# Patient Record
Sex: Female | Born: 1950 | Race: Black or African American | Hispanic: No | Smoking: Former smoker
Health system: Southern US, Community
[De-identification: ages and names within clinical notes are randomized; demographics above are authoritative.]

## PROBLEM LIST (undated history)

## (undated) DIAGNOSIS — Z860100 Personal history of colon polyps, unspecified: Secondary | ICD-10-CM

## (undated) DIAGNOSIS — I10 Essential (primary) hypertension: Secondary | ICD-10-CM

## (undated) DIAGNOSIS — K5790 Diverticulosis of intestine, part unspecified, without perforation or abscess without bleeding: Secondary | ICD-10-CM

## (undated) DIAGNOSIS — E7800 Pure hypercholesterolemia, unspecified: Secondary | ICD-10-CM

## (undated) HISTORY — PX: TUBAL LIGATION: SHX77

## (undated) HISTORY — PX: EYE SURGERY: SHX253

---

## 2005-08-05 ENCOUNTER — Ambulatory Visit: Payer: Self-pay | Admitting: Family Medicine

## 2005-11-03 ENCOUNTER — Ambulatory Visit: Payer: Self-pay | Admitting: Gastroenterology

## 2006-03-16 ENCOUNTER — Ambulatory Visit: Payer: Self-pay | Admitting: Gastroenterology

## 2012-01-26 ENCOUNTER — Ambulatory Visit: Payer: Self-pay | Admitting: Family Medicine

## 2012-10-14 ENCOUNTER — Ambulatory Visit: Payer: Self-pay | Admitting: Gastroenterology

## 2012-10-14 HISTORY — PX: COLONOSCOPY: SHX174

## 2012-10-17 LAB — PATHOLOGY REPORT

## 2013-08-23 ENCOUNTER — Ambulatory Visit: Payer: Self-pay | Admitting: Family Medicine

## 2013-11-27 ENCOUNTER — Emergency Department: Payer: Self-pay | Admitting: Emergency Medicine

## 2013-12-01 ENCOUNTER — Emergency Department: Payer: Self-pay | Admitting: Internal Medicine

## 2015-01-29 ENCOUNTER — Other Ambulatory Visit: Payer: Self-pay | Admitting: Family Medicine

## 2015-01-29 DIAGNOSIS — Z1231 Encounter for screening mammogram for malignant neoplasm of breast: Secondary | ICD-10-CM

## 2015-02-06 ENCOUNTER — Ambulatory Visit
Admission: RE | Admit: 2015-02-06 | Discharge: 2015-02-06 | Disposition: A | Discharge status: Home / Self Care | Payer: Medicare Other | Source: Ambulatory Visit | Attending: Family Medicine | Admitting: Family Medicine

## 2015-02-06 ENCOUNTER — Other Ambulatory Visit: Payer: Self-pay | Admitting: Family Medicine

## 2015-02-06 DIAGNOSIS — Z1231 Encounter for screening mammogram for malignant neoplasm of breast: Secondary | ICD-10-CM

## 2016-06-04 ENCOUNTER — Other Ambulatory Visit: Payer: Self-pay | Admitting: Family Medicine

## 2016-06-04 DIAGNOSIS — Z1231 Encounter for screening mammogram for malignant neoplasm of breast: Secondary | ICD-10-CM

## 2016-06-15 ENCOUNTER — Ambulatory Visit
Admission: RE | Admit: 2016-06-15 | Discharge: 2016-06-15 | Disposition: A | Discharge status: Home / Self Care | Payer: Medicare Other | Source: Ambulatory Visit | Attending: Family Medicine | Admitting: Family Medicine

## 2016-06-15 DIAGNOSIS — Z1231 Encounter for screening mammogram for malignant neoplasm of breast: Secondary | ICD-10-CM | POA: Insufficient documentation

## 2016-12-25 ENCOUNTER — Other Ambulatory Visit: Payer: Self-pay

## 2016-12-25 ENCOUNTER — Emergency Department
Admission: EM | Admit: 2016-12-25 | Discharge: 2016-12-25 | Disposition: A | Discharge status: Home / Self Care | Payer: 59 | Attending: Emergency Medicine | Admitting: Emergency Medicine

## 2016-12-25 ENCOUNTER — Emergency Department: Payer: 59

## 2016-12-25 ENCOUNTER — Encounter: Payer: Self-pay | Admitting: Emergency Medicine

## 2016-12-25 DIAGNOSIS — S5012XA Contusion of left forearm, initial encounter: Secondary | ICD-10-CM | POA: Diagnosis not present

## 2016-12-25 DIAGNOSIS — Y939 Activity, unspecified: Secondary | ICD-10-CM | POA: Insufficient documentation

## 2016-12-25 DIAGNOSIS — Y92410 Unspecified street and highway as the place of occurrence of the external cause: Secondary | ICD-10-CM | POA: Diagnosis not present

## 2016-12-25 DIAGNOSIS — Z880 Allergy status to penicillin: Secondary | ICD-10-CM | POA: Diagnosis not present

## 2016-12-25 DIAGNOSIS — S161XXA Strain of muscle, fascia and tendon at neck level, initial encounter: Secondary | ICD-10-CM | POA: Diagnosis not present

## 2016-12-25 DIAGNOSIS — S199XXA Unspecified injury of neck, initial encounter: Secondary | ICD-10-CM | POA: Diagnosis present

## 2016-12-25 DIAGNOSIS — Y998 Other external cause status: Secondary | ICD-10-CM | POA: Diagnosis not present

## 2016-12-25 MED ORDER — METHOCARBAMOL 500 MG PO TABS: 500.0000 mg | ORAL_TABLET | Freq: Four times a day (QID) | ORAL | 0 refills | Status: DC

## 2016-12-25 MED ORDER — MELOXICAM 15 MG PO TABS: 15.0000 mg | ORAL_TABLET | Freq: Every day | ORAL | 2 refills | Status: AC
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
## 2016-12-25 NOTE — Discharge Instructions (Signed)
Follow-up with your regular doctor if you are not better in 5-7 days, apply ice to your forearm, wet heat followed by ice to your shoulders and neck, use the medication as needed for pain and inflammation, Robaxin is for muscle strain, be careful while taking this medication as it may make you drowsy, if you are becoming worse please return to the emergency department
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
## 2017-06-17 ENCOUNTER — Other Ambulatory Visit: Payer: Self-pay | Admitting: Family Medicine

## 2017-06-17 DIAGNOSIS — Z1231 Encounter for screening mammogram for malignant neoplasm of breast: Secondary | ICD-10-CM

## 2017-06-22 ENCOUNTER — Ambulatory Visit
Admission: RE | Admit: 2017-06-22 | Discharge: 2017-06-22 | Disposition: A | Discharge status: Home / Self Care | Payer: Medicare Other | Source: Ambulatory Visit | Attending: Family Medicine | Admitting: Family Medicine

## 2017-06-22 ENCOUNTER — Encounter (INDEPENDENT_AMBULATORY_CARE_PROVIDER_SITE_OTHER): Payer: Self-pay

## 2017-06-22 DIAGNOSIS — Z1231 Encounter for screening mammogram for malignant neoplasm of breast: Secondary | ICD-10-CM | POA: Insufficient documentation

## 2018-02-14 ENCOUNTER — Encounter: Payer: Self-pay | Admitting: *Deleted

## 2018-02-15 ENCOUNTER — Ambulatory Visit: Payer: Medicare Other | Admitting: Anesthesiology

## 2018-02-15 ENCOUNTER — Ambulatory Visit
Admission: RE | Admit: 2018-02-15 | Discharge: 2018-02-15 | Disposition: A | Discharge status: Home / Self Care | Payer: Medicare Other | Attending: Gastroenterology | Admitting: Gastroenterology

## 2018-02-15 ENCOUNTER — Encounter
Admission: RE | Disposition: A | Discharge status: Home / Self Care | Payer: Self-pay | Source: Home / Self Care | Attending: Gastroenterology

## 2018-02-15 ENCOUNTER — Encounter: Payer: Self-pay | Admitting: Anesthesiology

## 2018-02-15 DIAGNOSIS — Z87891 Personal history of nicotine dependence: Secondary | ICD-10-CM | POA: Diagnosis not present

## 2018-02-15 DIAGNOSIS — Z6834 Body mass index (BMI) 34.0-34.9, adult: Secondary | ICD-10-CM | POA: Diagnosis not present

## 2018-02-15 DIAGNOSIS — E669 Obesity, unspecified: Secondary | ICD-10-CM | POA: Insufficient documentation

## 2018-02-15 DIAGNOSIS — I10 Essential (primary) hypertension: Secondary | ICD-10-CM | POA: Diagnosis not present

## 2018-02-15 DIAGNOSIS — D122 Benign neoplasm of ascending colon: Secondary | ICD-10-CM | POA: Insufficient documentation

## 2018-02-15 DIAGNOSIS — E7800 Pure hypercholesterolemia, unspecified: Secondary | ICD-10-CM | POA: Insufficient documentation

## 2018-02-15 DIAGNOSIS — Z86010 Personal history of colonic polyps: Secondary | ICD-10-CM | POA: Diagnosis not present

## 2018-02-15 DIAGNOSIS — D123 Benign neoplasm of transverse colon: Secondary | ICD-10-CM | POA: Diagnosis not present

## 2018-02-15 DIAGNOSIS — Z7982 Long term (current) use of aspirin: Secondary | ICD-10-CM | POA: Diagnosis not present

## 2018-02-15 DIAGNOSIS — Z79899 Other long term (current) drug therapy: Secondary | ICD-10-CM | POA: Insufficient documentation

## 2018-02-15 DIAGNOSIS — K5730 Diverticulosis of large intestine without perforation or abscess without bleeding: Secondary | ICD-10-CM | POA: Insufficient documentation

## 2018-02-15 DIAGNOSIS — Z1211 Encounter for screening for malignant neoplasm of colon: Secondary | ICD-10-CM | POA: Diagnosis present

## 2018-02-15 HISTORY — PX: COLONOSCOPY WITH PROPOFOL: SHX5780

## 2018-02-15 HISTORY — DX: Pure hypercholesterolemia, unspecified: E78.00

## 2018-02-15 HISTORY — DX: Essential (primary) hypertension: I10

## 2018-02-15 HISTORY — DX: Diverticulosis of intestine, part unspecified, without perforation or abscess without bleeding: K57.90

## 2018-02-15 HISTORY — DX: Personal history of colonic polyps: Z86.010

## 2018-02-15 HISTORY — DX: Personal history of colon polyps, unspecified: Z86.0100

## 2018-02-15 LAB — CBC WITH DIFFERENTIAL/PLATELET
Abs Immature Granulocytes: 0.01 10*3/uL (ref 0.00–0.07)
Basophils Absolute: 0 10*3/uL (ref 0.0–0.1)
Basophils Relative: 1 %
Eosinophils Absolute: 0.1 10*3/uL (ref 0.0–0.5)
Eosinophils Relative: 2 %
HCT: 39.7 % (ref 36.0–46.0)
Hemoglobin: 13.2 g/dL (ref 12.0–15.0)
Immature Granulocytes: 0 %
LYMPHS ABS: 1.3 10*3/uL (ref 0.7–4.0)
Lymphocytes Relative: 36 %
MCH: 27.8 pg (ref 26.0–34.0)
MCHC: 33.2 g/dL (ref 30.0–36.0)
MCV: 83.8 fL (ref 80.0–100.0)
Monocytes Absolute: 0.3 10*3/uL (ref 0.1–1.0)
Monocytes Relative: 8 %
NRBC: 0 % (ref 0.0–0.2)
Neutro Abs: 1.9 10*3/uL (ref 1.7–7.7)
Neutrophils Relative %: 53 %
Platelets: 241 10*3/uL (ref 150–400)
RBC: 4.74 MIL/uL (ref 3.87–5.11)
RDW: 13.2 % (ref 11.5–15.5)
WBC: 3.5 10*3/uL — ABNORMAL LOW (ref 4.0–10.5)

## 2018-02-15 SURGERY — COLONOSCOPY WITH PROPOFOL
Anesthesia: General

## 2018-02-15 MED ORDER — SODIUM CHLORIDE 0.9 % IV SOLN
INTRAVENOUS | Status: DC
  Administered 2018-02-15: 10:00:00 via INTRAVENOUS
  Administered 2018-02-15: 1000 mL via INTRAVENOUS

## 2018-02-15 MED ORDER — SODIUM CHLORIDE (PF) 0.9 % IJ SOLN
INTRAMUSCULAR | Status: DC | PRN
  Administered 2018-02-15: 4.5 mL via INTRAVENOUS
  Administered 2018-02-15: 2.5 mL via INTRAVENOUS

## 2018-02-15 MED ORDER — FENTANYL CITRATE (PF) 100 MCG/2ML IJ SOLN
INTRAMUSCULAR | Status: DC | PRN
  Administered 2018-02-15: 50 ug via INTRAVENOUS

## 2018-02-15 MED ORDER — PROPOFOL 500 MG/50ML IV EMUL
INTRAVENOUS | Status: DC | PRN
  Administered 2018-02-15: 150 ug/kg/min via INTRAVENOUS

## 2018-02-15 MED ORDER — MIDAZOLAM HCL 2 MG/2ML IJ SOLN
INTRAMUSCULAR | Status: DC | PRN
  Administered 2018-02-15: 1 mg via INTRAVENOUS

## 2018-02-15 MED ORDER — PHENYLEPHRINE HCL 10 MG/ML IJ SOLN
INTRAMUSCULAR | Status: DC | PRN
  Administered 2018-02-15 (×2): 100 ug via INTRAVENOUS

## 2018-02-15 MED ORDER — FENTANYL CITRATE (PF) 100 MCG/2ML IJ SOLN
INTRAMUSCULAR | Status: AC
  Filled 2018-02-15: qty 2

## 2018-02-15 MED ORDER — MIDAZOLAM HCL 2 MG/2ML IJ SOLN
INTRAMUSCULAR | Status: AC
  Filled 2018-02-15: qty 2

## 2018-02-15 MED ORDER — PROPOFOL 10 MG/ML IV BOLUS
INTRAVENOUS | Status: DC | PRN
  Administered 2018-02-15: 40 mg via INTRAVENOUS

## 2018-02-15 NOTE — Transfer of Care (Signed)
Immediate Anesthesia Transfer of Care Note  Patient: Wanda Abbott  Procedure(s) Performed: COLONOSCOPY WITH PROPOFOL (N/A )  Patient Location: PACU  Anesthesia Type:General  Level of Consciousness: awake and alert   Airway & Oxygen Therapy: Patient Spontanous Breathing and Patient connected to nasal cannula oxygen  Post-op Assessment: Report given to RN and Post -op Vital signs reviewed and stable  Post vital signs: Reviewed and stable  Last Vitals:  Vitals Value Taken Time  BP 116/86 02/15/2018 11:12 AM  Temp 36.2 C 02/15/2018 11:06 AM  Pulse 65 02/15/2018 11:14 AM  Resp 14 02/15/2018 11:14 AM  SpO2 100 % 02/15/2018 11:14 AM  Vitals shown include unvalidated device data.  Last Pain:  Vitals:   02/15/18 1106  TempSrc: Tympanic  PainSc: Asleep         Complications: No apparent anesthesia complications

## 2018-02-15 NOTE — Anesthesia Post-op Follow-up Note (Signed)
Anesthesia QCDR form completed.        

## 2018-02-15 NOTE — Anesthesia Postprocedure Evaluation (Signed)
Anesthesia Post Note  Patient: Wanda Abbott  Procedure(s) Performed: COLONOSCOPY WITH PROPOFOL (N/A )  Patient location during evaluation: Endoscopy Anesthesia Type: General Level of consciousness: awake and alert Pain management: pain level controlled Vital Signs Assessment: post-procedure vital signs reviewed and stable Respiratory status: spontaneous breathing, nonlabored ventilation, respiratory function stable and patient connected to nasal cannula oxygen Cardiovascular status: blood pressure returned to baseline and stable Postop Assessment: no apparent nausea or vomiting Anesthetic complications: no     Last Vitals:  Vitals:   02/15/18 1126 02/15/18 1136  BP: 125/75 132/85  Pulse:    Resp:    Temp:    SpO2:      Last Pain:  Vitals:   02/15/18 1136  TempSrc:   PainSc: 0-No pain                 Amparo Donalson S

## 2018-02-15 NOTE — Op Note (Signed)
[facility] Gastroenterology Patient Name: Wanda Abbott Procedure Date: 02/15/2018 10:11 AM MRN: [id number] Account #: [id number] Date of Birth: 11-27-1950 Admit Type: Outpatient Age: 68 Room: [facility] ENDO ROOM 2 Gender: Female Note Status: Finalized Procedure:            Colonoscopy Indications:          Personal history of colonic polyps Providers:            Lollie Sails, MD Referring MD:         Dion Body (Referring MD) Medicines:            Monitored Anesthesia Care Complications:        No immediate complications. Procedure:            Pre-Anesthesia Assessment:                       - ASA Grade Assessment: III - A patient with severe                        systemic disease.                       After obtaining informed consent, the colonoscope was                        passed under direct vision. Throughout the procedure,                        the patient's blood pressure, pulse, and oxygen                        saturations were monitored continuously. The                        Colonoscope was introduced through the anus and                        advanced to the the cecum, identified by appendiceal                        orifice and ileocecal valve. The colonoscopy was                        performed with difficulty. Successful completion of the                        procedure was aided by changing the patient to a prone                        position. The patient tolerated the procedure well. The                        quality of the bowel preparation was good. Findings:      Multiple small and large-mouthed diverticula were found in the sigmoid       colon, descending colon, transverse colon and ascending colon.      A 24 mm polyp was found in the proximal transverse colon. The polyp was       sessile. The polyp was removed with a hot snare. The polyp was removed       with  a saline injection-lift technique using a hot snare, cold  snare and       cold forcep. Resection and retrieval were complete. Area was tattooed       with an injection of 2 mL of [place] ink.      A 9 mm polyp was found in the proximal ascending colon. The polyp was       semi-pedunculated. The polyp was removed with a cold snare. Resection       and retrieval were complete.      The retroflexed view of the distal rectum and anal verge was normal and       showed no anal or rectal abnormalities.      The digital rectal exam was normal.      A tattoo was seen in the ascending colon. A post-polypectomy scar was       found at the tattoo site. There was no evidence of residual polyp tissue. Impression:           - Diverticulosis in the sigmoid colon, in the                        descending colon, in the transverse colon and in the                        ascending colon.                       - One 24 mm polyp in the proximal transverse colon,                        removed with a hot snare and removed using                        injection-lift and a hot snare. Resected and retrieved.                        Tattooed.                       - One 9 mm polyp in the proximal ascending colon,                        removed with a cold snare. Resected and retrieved.                       - The distal rectum and anal verge are normal on                        retroflexion view.                       - A tattoo was seen in the ascending colon. A                        post-polypectomy scar was found at the tattoo site.                        There was no evidence of residual polyp tissue. Recommendation:       - Discharge patient to home.                       -  Await pathology results.                       - Telephone GI clinic for pathology results in 1 week. Procedure Code(s):    --- Professional ---                       [phone], Colonoscopy, flexible; with removal of tumor(s),                        polyp(s), or other lesion(s) by snare technique                        [id number], Colonoscopy, flexible; with directed submucosal                        injection(s), any substance Diagnosis Code(s):    --- Professional ---                       D12.3, Benign neoplasm of transverse colon (hepatic                        flexure or splenic flexure)                       D12.2, Benign neoplasm of ascending colon                       Z86.010, Personal history of colonic polyps                       K57.30, Diverticulosis of large intestine without                        perforation or abscess without bleeding CPT copyright 2018 American Medical Association. All rights reserved. The codes documented in this report are preliminary and upon coder review may  be revised to meet current compliance requirements. Lollie Sails, MD 02/15/2018 11:04:50 AM This report has been signed electronically. Number of Addenda: 0 Note Initiated On: 02/15/2018 10:11 AM Scope Withdrawal Time: 0 hours 15 minutes 18 seconds  Total Procedure Duration: 0 hours 37 minutes 11 seconds       [facility]

## 2018-02-15 NOTE — Anesthesia Procedure Notes (Signed)
Date/Time: 02/15/2018 10:15 AM Performed by: Allean Found, CRNA Pre-anesthesia Checklist: Patient identified, Emergency Drugs available, Suction available, Patient being monitored and Timeout performed Oxygen Delivery Method: Nasal cannula Placement Confirmation: positive ETCO2

## 2018-02-15 NOTE — H&P (Signed)
Outpatient short stay form Pre-procedure 02/15/2018 10:06 AM Lollie Sails MD  Primary Physician: Dr Dion Body  Reason for visit: Colonoscopy  History of present illness: Patient is a 68 year old female presenting today for colonoscopy in regards her personal history of adenomatous colon polyps.  Her last colonoscopy was 10/14/2012.  She does have a scar in the ascending colon previously marked with a tattoo.  She tolerated her prep well.  She takes a 81 mg aspirin daily held this morning.  She takes no other aspirin products or blood thinning agent.    Current Facility-Administered Medications:  .  0.9 %  sodium chloride infusion, , Intravenous, Continuous, Lollie Sails, MD  Medications Prior to Admission  Medication Sig Dispense Refill Last Dose  . aspirin EC 81 MG tablet Take 81 mg by mouth daily.     . calcium carbonate (OSCAL) 1500 (600 Ca) MG TABS tablet Take 1,500 mg by mouth 2 (two) times daily with a meal.     . fluticasone (FLONASE) 50 MCG/ACT nasal spray Place 2 sprays into both nostrils daily.     . hydrochlorothiazide (HYDRODIURIL) 25 MG tablet Take 25 mg by mouth daily.     Marland Kitchen losartan (COZAAR) 100 MG tablet Take 100 mg by mouth daily.     Marland Kitchen lovastatin (MEVACOR) 20 MG tablet Take 20 mg by mouth at bedtime.     . Multiple Vitamin (MULTIVITAMIN) tablet Take 1 tablet by mouth daily.        Allergies  Allergen Reactions  . Penicillins Anaphylaxis  . Shellfish Allergy Other (See Comments)     Past Medical History:  Diagnosis Date  . Diverticulosis   . History of colonic polyps   . Hypercholesterolemia   . Hypertension     Review of systems:      Physical Exam    Heart and lungs: Regular rate and rhythm without rub or gallop, lungs are bilaterally clear.    HEENT: Normocephalic atraumatic eyes are anicteric    Other:    Pertinant exam for procedure: Soft nontender nondistended bowel sounds positive normoactive.    Planned proceedures:  Colonoscopy and indicated procedures. I have discussed the risks benefits and complications of procedures to include not limited to bleeding, infection, perforation and the risk of sedation and the patient wishes to proceed.    Lollie Sails, MD Gastroenterology 02/15/2018  10:06 AM

## 2018-02-15 NOTE — Anesthesia Preprocedure Evaluation (Signed)
Anesthesia Evaluation  Patient identified by MRN, date of birth, ID band Patient awake    Reviewed: Allergy & Precautions, NPO status , Patient's Chart, lab work & pertinent test results, reviewed documented beta blocker date and time   Airway Mallampati: III  TM Distance: >3 FB     Dental  (+) Chipped   Pulmonary former smoker,           Cardiovascular hypertension, Pt. on medications      Neuro/Psych    GI/Hepatic   Endo/Other    Renal/GU      Musculoskeletal   Abdominal   Peds  Hematology   Anesthesia Other Findings Obese.  Reproductive/Obstetrics                             Anesthesia Physical Anesthesia Plan  ASA: III  Anesthesia Plan: General   Post-op Pain Management:    Induction: Intravenous  PONV Risk Score and Plan:   Airway Management Planned:   Additional Equipment:   Intra-op Plan:   Post-operative Plan:   Informed Consent: I have reviewed the patients History and Physical, chart, labs and discussed the procedure including the risks, benefits and alternatives for the proposed anesthesia with the patient or authorized representative who has indicated his/her understanding and acceptance.       Plan Discussed with: CRNA  Anesthesia Plan Comments:         Anesthesia Quick Evaluation

## 2018-02-16 ENCOUNTER — Encounter: Payer: Self-pay | Admitting: Gastroenterology

## 2018-02-16 LAB — SURGICAL PATHOLOGY

## 2018-09-12 ENCOUNTER — Other Ambulatory Visit: Payer: Self-pay | Admitting: Family Medicine

## 2018-09-12 DIAGNOSIS — Z1231 Encounter for screening mammogram for malignant neoplasm of breast: Secondary | ICD-10-CM

## 2018-10-04 ENCOUNTER — Ambulatory Visit
Admission: RE | Admit: 2018-10-04 | Discharge: 2018-10-04 | Disposition: A | Discharge status: Home / Self Care | Payer: Medicare Other | Source: Ambulatory Visit | Attending: Family Medicine | Admitting: Family Medicine

## 2018-10-04 ENCOUNTER — Other Ambulatory Visit: Payer: Self-pay

## 2018-10-04 DIAGNOSIS — Z1231 Encounter for screening mammogram for malignant neoplasm of breast: Secondary | ICD-10-CM | POA: Diagnosis not present

## 2019-02-17 ENCOUNTER — Ambulatory Visit: Payer: Medicare Other | Attending: Internal Medicine

## 2019-02-17 DIAGNOSIS — Z23 Encounter for immunization: Secondary | ICD-10-CM

## 2019-02-17 NOTE — Progress Notes (Signed)
   Covid-19 Vaccination Clinic  Name:  Wanda Abbott    MRN: [id number] DOB: 1950/12/06  02/17/2019  [name] was observed post Covid-19 immunization for 30 minutes based on pre-vaccination screening without incidence. She was provided with Vaccine Information Sheet and instruction to access the V-Safe system.   [name] was instructed to call 911 with any severe reactions post vaccine: Marland Kitchen Difficulty breathing  . Swelling of your face and throat  . A fast heartbeat  . A bad rash all over your body  . Dizziness and weakness    Immunizations Administered    Name Date Dose VIS Date Route   Moderna COVID-19 Vaccine 02/17/2019  3:18 PM 0.5 mL 12/13/2018 Intramuscular   Manufacturer: Moderna   Lot: [id number]   [id number]

## 2019-03-21 ENCOUNTER — Ambulatory Visit: Payer: Medicare Other | Attending: Internal Medicine

## 2019-03-21 DIAGNOSIS — Z23 Encounter for immunization: Secondary | ICD-10-CM | POA: Insufficient documentation

## 2019-03-21 NOTE — Progress Notes (Signed)
   Covid-19 Vaccination Clinic  Name:  Wanda Abbott    MRN: [id number] DOB: 04-19-1950  03/21/2019  [name] was observed post Covid-19 immunization for 15 minutes without incident. She was provided with Vaccine Information Sheet and instruction to access the V-Safe system.   [name] was instructed to call 911 with any severe reactions post vaccine: Marland Kitchen Difficulty breathing  . Swelling of face and throat  . A fast heartbeat  . A bad rash all over body  . Dizziness and weakness   Immunizations Administered    Name Date Dose VIS Date Route   Moderna COVID-19 Vaccine 03/21/2019  2:35 PM 0.5 mL 12/13/2018 Intramuscular   Manufacturer: Moderna   Lot: [id number]   [place]:[id number]

## 2019-10-23 ENCOUNTER — Other Ambulatory Visit: Payer: Self-pay | Admitting: Family Medicine

## 2019-10-23 DIAGNOSIS — Z1231 Encounter for screening mammogram for malignant neoplasm of breast: Secondary | ICD-10-CM

## 2019-10-30 ENCOUNTER — Ambulatory Visit
Admission: RE | Admit: 2019-10-30 | Discharge: 2019-10-30 | Disposition: A | Discharge status: Home / Self Care | Payer: Medicare Other | Source: Ambulatory Visit | Attending: Family Medicine | Admitting: Family Medicine

## 2019-10-30 ENCOUNTER — Other Ambulatory Visit: Payer: Self-pay

## 2019-10-30 DIAGNOSIS — Z1231 Encounter for screening mammogram for malignant neoplasm of breast: Secondary | ICD-10-CM | POA: Insufficient documentation

## 2020-08-20 ENCOUNTER — Encounter: Payer: Self-pay | Admitting: Ophthalmology

## 2020-08-23 NOTE — Discharge Instructions (Signed)

## 2020-08-27 ENCOUNTER — Encounter
Admission: RE | Disposition: A | Discharge status: Home / Self Care | Payer: Self-pay | Source: Home / Self Care | Attending: Ophthalmology

## 2020-08-27 ENCOUNTER — Other Ambulatory Visit: Payer: Self-pay

## 2020-08-27 ENCOUNTER — Ambulatory Visit: Payer: Medicare Other | Admitting: Anesthesiology

## 2020-08-27 ENCOUNTER — Encounter: Payer: Self-pay | Admitting: Ophthalmology

## 2020-08-27 ENCOUNTER — Ambulatory Visit
Admission: RE | Admit: 2020-08-27 | Discharge: 2020-08-27 | Disposition: A | Discharge status: Home / Self Care | Payer: Medicare Other | Attending: Ophthalmology | Admitting: Ophthalmology

## 2020-08-27 DIAGNOSIS — Z79899 Other long term (current) drug therapy: Secondary | ICD-10-CM | POA: Insufficient documentation

## 2020-08-27 DIAGNOSIS — H2512 Age-related nuclear cataract, left eye: Secondary | ICD-10-CM | POA: Diagnosis not present

## 2020-08-27 DIAGNOSIS — Z91013 Allergy to seafood: Secondary | ICD-10-CM | POA: Insufficient documentation

## 2020-08-27 DIAGNOSIS — Z7982 Long term (current) use of aspirin: Secondary | ICD-10-CM | POA: Insufficient documentation

## 2020-08-27 DIAGNOSIS — Z880 Allergy status to penicillin: Secondary | ICD-10-CM | POA: Insufficient documentation

## 2020-08-27 DIAGNOSIS — Z87891 Personal history of nicotine dependence: Secondary | ICD-10-CM | POA: Insufficient documentation

## 2020-08-27 HISTORY — PX: CATARACT EXTRACTION W/PHACO: SHX586

## 2020-08-27 SURGERY — PHACOEMULSIFICATION, CATARACT, WITH IOL INSERTION
Anesthesia: Monitor Anesthesia Care | Site: Eye | Laterality: Left

## 2020-08-27 MED ORDER — OXYCODONE HCL 5 MG/5ML PO SOLN
5.0000 mg | Freq: Once | ORAL | Status: DC | PRN
Start: 2020-08-27 — End: 2020-08-27

## 2020-08-27 MED ORDER — SIGHTPATH DOSE#1 SODIUM HYALURONATE 23 MG/ML IO SOLUTION
PREFILLED_SYRINGE | INTRAOCULAR | Status: DC | PRN
  Administered 2020-08-27: .6 mL via INTRAOCULAR

## 2020-08-27 MED ORDER — MIDAZOLAM HCL 2 MG/2ML IJ SOLN
INTRAMUSCULAR | Status: DC | PRN
  Administered 2020-08-27 (×2): .5 mg via INTRAVENOUS
  Administered 2020-08-27: 1 mg via INTRAVENOUS

## 2020-08-27 MED ORDER — OXYCODONE HCL 5 MG PO TABS: 5.0000 mg | ORAL_TABLET | Freq: Once | ORAL | Status: DC | PRN

## 2020-08-27 MED ORDER — SIGHTPATH DOSE#1 BSS IO SOLN
INTRAOCULAR | Status: DC | PRN
  Administered 2020-08-27: 60 mL via OPHTHALMIC

## 2020-08-27 MED ORDER — SIGHTPATH DOSE#1 BSS IO SOLN
INTRAOCULAR | Status: DC | PRN
  Administered 2020-08-27: 15 mL

## 2020-08-27 MED ORDER — MOXIFLOXACIN HCL 0.5 % OP SOLN
OPHTHALMIC | Status: DC | PRN
  Administered 2020-08-27: 0.2 mL via OPHTHALMIC

## 2020-08-27 MED ORDER — LACTATED RINGERS IV SOLN: INTRAVENOUS | Status: DC

## 2020-08-27 MED ORDER — CYCLOPENTOLATE HCL 2 % OP SOLN
1.0000 [drp] | OPHTHALMIC | Status: DC | PRN
  Administered 2020-08-27 (×3): 1 [drp] via OPHTHALMIC

## 2020-08-27 MED ORDER — TETRACAINE HCL 0.5 % OP SOLN
1.0000 [drp] | OPHTHALMIC | Status: DC | PRN
  Administered 2020-08-27 (×3): 1 [drp] via OPHTHALMIC

## 2020-08-27 MED ORDER — PHENYLEPHRINE HCL 10 % OP SOLN
1.0000 [drp] | OPHTHALMIC | Status: DC | PRN
Start: 2020-08-27 — End: 2020-08-27
  Administered 2020-08-27 (×3): 1 [drp] via OPHTHALMIC

## 2020-08-27 MED ORDER — LIDOCAINE HCL (PF) 2 % IJ SOLN
INTRAOCULAR | Status: DC | PRN
  Administered 2020-08-27: 1 mL via INTRAOCULAR

## 2020-08-27 MED ORDER — FENTANYL CITRATE (PF) 100 MCG/2ML IJ SOLN
INTRAMUSCULAR | Status: DC | PRN
  Administered 2020-08-27: 50 ug via INTRAVENOUS
  Administered 2020-08-27 (×2): 25 ug via INTRAVENOUS

## 2020-08-27 MED ORDER — SIGHTPATH DOSE#1 SODIUM HYALURONATE 10 MG/ML IO SOLUTION
PREFILLED_SYRINGE | INTRAOCULAR | Status: DC | PRN
  Administered 2020-08-27: 0.55 mL via INTRAOCULAR

## 2020-08-27 SURGICAL SUPPLY — 14 items
CANNULA ANT/CHMB 27GA (MISCELLANEOUS) ×4 IMPLANT
DISSECTOR HYDRO NUCLEUS 50X22 (MISCELLANEOUS) ×2 IMPLANT
GLOVE SURG ENC TEXT LTX SZ7.5 (GLOVE) ×2 IMPLANT
GLOVE SURG SYN 8.5  E (GLOVE) ×1
GLOVE SURG SYN 8.5 E (GLOVE) ×1 IMPLANT
GOWN STRL REUS W/ TWL LRG LVL3 (GOWN DISPOSABLE) ×2 IMPLANT
GOWN STRL REUS W/TWL LRG LVL3 (GOWN DISPOSABLE) ×4
LENS IOL TECNIS EYHANCE 18.5 (Intraocular Lens) ×2 IMPLANT
MARKER SKIN DUAL TIP RULER LAB (MISCELLANEOUS) ×2 IMPLANT
PACK EYE AFTER SURG (MISCELLANEOUS) ×2 IMPLANT
SYR 3ML LL SCALE MARK (SYRINGE) ×2 IMPLANT
SYR TB 1ML LUER SLIP (SYRINGE) ×2 IMPLANT
WATER STERILE IRR 250ML POUR (IV SOLUTION) ×2 IMPLANT
WIPE NON LINTING 3.25X3.25 (MISCELLANEOUS) ×2 IMPLANT

## 2020-08-27 NOTE — H&P (Signed)
[facility]   Primary Care Physician:  Dion Body, MD Ophthalmologist: Dr. Benay Pillow  Pre-Procedure History & Physical: HPI:  Wanda Abbott is a 70 y.o. female here for cataract surgery.   Past Medical History:  Diagnosis Date   Diverticulosis    History of colonic polyps    Hypercholesterolemia    Hypertension     Past Surgical History:  Procedure Laterality Date   COLONOSCOPY  10/14/2012   COLONOSCOPY WITH PROPOFOL N/A 02/15/2018   Procedure: COLONOSCOPY WITH PROPOFOL;  Surgeon: Lollie Sails, MD;  Location: [facility] ENDOSCOPY;  Service: Endoscopy;  Laterality: N/A;   TUBAL LIGATION      Prior to Admission medications   Medication Sig Start Date End Date Taking? Authorizing Provider  aspirin EC 81 MG tablet Take 81 mg by mouth daily.   Yes [provider]  calcium carbonate (OSCAL) 1500 (600 Ca) MG TABS tablet Take 1,500 mg by mouth 2 (two) times daily with a meal.   Yes [provider]  fluticasone (FLONASE) 50 MCG/ACT nasal spray Place 2 sprays into both nostrils daily.   Yes [provider]  hydrochlorothiazide (HYDRODIURIL) 25 MG tablet Take 25 mg by mouth daily.   Yes [provider]  losartan (COZAAR) 100 MG tablet Take 100 mg by mouth daily.   Yes [provider]  lovastatin (MEVACOR) 20 MG tablet Take 20 mg by mouth at bedtime.   Yes [provider]  Multiple Vitamin (MULTIVITAMIN) tablet Take 1 tablet by mouth daily.   Yes [provider]    Allergies as of 07/31/2020 - Review Complete 02/15/2018  Allergen Reaction Noted   Penicillins Anaphylaxis 12/25/2016   Shellfish allergy Other (See Comments) 02/14/2018    Family History  Problem Relation Age of Onset   Breast cancer Sister 51    Social History   Socioeconomic History   Marital status: Widowed    Spouse name: Not on file   Number of children: Not on file   Years of education: Not on file   Highest education level:  Not on file  Occupational History   Not on file  Tobacco Use   Smoking status: Former    Packs/day: 1.00    Years: 24.00    Pack years: 24.00    Types: Cigarettes    Quit date: 03/01/1986    Years since quitting: 34.5   Smokeless tobacco: Current    Types: Chew  Vaping Use   Vaping Use: Never used  Substance and Sexual Activity   Alcohol use: Not Currently   Drug use: Never   Sexual activity: Not on file  Other Topics Concern   Not on file  Social History Narrative   Not on file   Social Determinants of Health   Financial Resource Strain: Not on file  Food Insecurity: Not on file  Transportation Needs: Not on file  Physical Activity: Not on file  Stress: Not on file  Social Connections: Not on file  Intimate Partner Violence: Not on file    Review of Systems: See HPI, otherwise negative ROS  Physical Exam: BP 124/84   Pulse 65   Temp 97.8 F (36.6 C) (Temporal)   Resp 18   Ht '5\' 2"'$  (1.575 m)   Wt 83.9 kg   SpO2 97%   BMI 33.84 kg/m  General:   Alert, cooperative in NAD Head:  Normocephalic and atraumatic. Respiratory:  Normal work of breathing. Cardiovascular:  RRR  Impression/Plan: Wanda Abbott  is here for cataract surgery.  Risks, benefits, limitations, and alternatives regarding cataract surgery have been reviewed with the patient.  Questions have been answered.  All parties agreeable.   Benay Pillow, MD  08/27/2020, 10:48 AM

## 2020-08-27 NOTE — Anesthesia Preprocedure Evaluation (Signed)
Anesthesia Evaluation  Patient identified by MRN, date of birth, ID band Patient awake    Reviewed: NPO status   History of Anesthesia Complications Negative for: history of anesthetic complications  Airway Mallampati: II  TM Distance: >3 FB Neck ROM: full    Dental no notable dental hx.    Pulmonary neg pulmonary ROS, former smoker,    Pulmonary exam normal        Cardiovascular Exercise Tolerance: Good hypertension, Normal cardiovascular exam+ dysrhythmias (PACs)      Neuro/Psych negative neurological ROS  negative psych ROS   GI/Hepatic negative GI ROS, Neg liver ROS,   Endo/Other  Morbid obesity (bmi 34)  Renal/GU CRFRenal disease (ckd3)  negative genitourinary   Musculoskeletal   Abdominal   Peds  Hematology negative hematology ROS (+)   Anesthesia Other Findings pcp: Dion Body, MD at 04/30/2020 ;  cards: Isaias Cowman, MD at 01/23/2020 ;  Reproductive/Obstetrics                             Anesthesia Physical Anesthesia Plan  ASA: 2  Anesthesia Plan: MAC   Post-op Pain Management:    Induction:   PONV Risk Score and Plan:   Airway Management Planned:   Additional Equipment:   Intra-op Plan:   Post-operative Plan:   Informed Consent: I have reviewed the patients History and Physical, chart, labs and discussed the procedure including the risks, benefits and alternatives for the proposed anesthesia with the patient or authorized representative who has indicated his/her understanding and acceptance.       Plan Discussed with: CRNA  Anesthesia Plan Comments:         Anesthesia Quick Evaluation

## 2020-08-27 NOTE — Op Note (Signed)
OPERATIVE NOTE  DIANDREA TIMIAN [id number] 08/27/2020   PREOPERATIVE DIAGNOSIS:  Nuclear sclerotic cataract left eye.  H25.12   POSTOPERATIVE DIAGNOSIS:    Nuclear sclerotic cataract left eye.     PROCEDURE:  Phacoemusification with posterior chamber intraocular lens placement of the left eye   LENS:   Implant Name Type Inv. Item Serial No. Manufacturer Lot No. LRB No. Used Action  LENS IOL TECNIS EYHANCE 18.5 - [id number] Intraocular Lens LENS IOL TECNIS EYHANCE 18.5 [id number] JOHNSON   Left 1 Implanted      Procedure(s): CATARACT EXTRACTION PHACO AND INTRAOCULAR LENS PLACEMENT (IOC) LEFT 3.91 00:25.6 (Left)  DIB00 +18.5   ULTRASOUND TIME: 0 minutes 25 seconds.  CDE 3.91   SURGEON:  Benay Pillow, MD, MPH   ANESTHESIA:  Topical with tetracaine drops augmented with 1% preservative-free intracameral lidocaine.  ESTIMATED BLOOD LOSS: <1 mL   COMPLICATIONS:  None.   DESCRIPTION OF PROCEDURE:  The patient was identified in the holding room and transported to the operating room and placed in the supine position under the operating microscope.  The left eye was identified as the operative eye and it was prepped and draped in the usual sterile ophthalmic fashion.   A 1.0 millimeter clear-corneal paracentesis was made at the 5:00 position. 0.5 ml of preservative-free 1% lidocaine with epinephrine was injected into the anterior chamber.  The anterior chamber was filled with Healon 5 viscoelastic.  A 2.4 millimeter keratome was used to make a near-clear corneal incision at the 2:00 position.  A curvilinear capsulorrhexis was made with a cystotome and capsulorrhexis forceps.  Balanced salt solution was used to hydrodissect and hydrodelineate the nucleus.   Phacoemulsification was then used in stop and chop fashion to remove the lens nucleus and epinucleus.  The remaining cortex was then removed using the irrigation and aspiration handpiece. Healon was then placed into the capsular bag to  distend it for lens placement.  A lens was then injected into the capsular bag.  The remaining viscoelastic was aspirated.   Wounds were hydrated with balanced salt solution.  The anterior chamber was inflated to a physiologic pressure with balanced salt solution.  Intracameral vigamox 0.1 mL undiltued was injected into the eye and a drop placed onto the ocular surface.  No wound leaks were noted.  The patient was taken to the recovery room in stable condition without complications of anesthesia or surgery  Benay Pillow 08/27/2020, 11:18 AM

## 2020-08-27 NOTE — Anesthesia Procedure Notes (Signed)
Procedure Name: MAC Date/Time: 08/27/2020 10:57 AM Performed by: Dionne Bucy, CRNA Pre-anesthesia Checklist: Patient identified, Emergency Drugs available, Suction available, Patient being monitored and Timeout performed Patient Re-evaluated:Patient Re-evaluated prior to induction Oxygen Delivery Method: Nasal cannula Placement Confirmation: positive ETCO2

## 2020-08-27 NOTE — Transfer of Care (Signed)
Immediate Anesthesia Transfer of Care Note  Patient: Wanda Abbott  Procedure(s) Performed: CATARACT EXTRACTION PHACO AND INTRAOCULAR LENS PLACEMENT (IOC) LEFT 3.91 00:25.6 (Left: Eye)  Patient Location: PACU  Anesthesia Type: MAC  Level of Consciousness: awake, alert  and patient cooperative  Airway and Oxygen Therapy: Patient Spontanous Breathing and Patient connected to supplemental oxygen  Post-op Assessment: Post-op Vital signs reviewed, Patient's Cardiovascular Status Stable, Respiratory Function Stable, Patent Airway and No signs of Nausea or vomiting  Post-op Vital Signs: Reviewed and stable  Complications: No notable events documented.

## 2020-08-27 NOTE — Anesthesia Postprocedure Evaluation (Signed)
Anesthesia Post Note  Patient: MAYLA BIDDY  Procedure(s) Performed: CATARACT EXTRACTION PHACO AND INTRAOCULAR LENS PLACEMENT (IOC) LEFT 3.91 00:25.6 (Left: Eye)     Patient location during evaluation: PACU Anesthesia Type: MAC Level of consciousness: awake and alert Pain management: pain level controlled Vital Signs Assessment: post-procedure vital signs reviewed and stable Respiratory status: spontaneous breathing, nonlabored ventilation, respiratory function stable and patient connected to nasal cannula oxygen Cardiovascular status: stable and blood pressure returned to baseline Postop Assessment: no apparent nausea or vomiting Anesthetic complications: no   No notable events documented.  Fidel Levy

## 2020-08-28 ENCOUNTER — Encounter: Payer: Self-pay | Admitting: Ophthalmology

## 2020-09-05 NOTE — Discharge Instructions (Signed)

## 2020-09-09 ENCOUNTER — Ambulatory Visit
Admission: RE | Admit: 2020-09-09 | Discharge: 2020-09-09 | Disposition: A | Discharge status: Home / Self Care | Payer: Medicare Other | Attending: Ophthalmology | Admitting: Ophthalmology

## 2020-09-09 ENCOUNTER — Ambulatory Visit: Payer: Medicare Other | Admitting: Anesthesiology

## 2020-09-09 ENCOUNTER — Other Ambulatory Visit: Payer: Self-pay

## 2020-09-09 ENCOUNTER — Encounter
Admission: RE | Disposition: A | Discharge status: Home / Self Care | Payer: Self-pay | Source: Home / Self Care | Attending: Ophthalmology

## 2020-09-09 ENCOUNTER — Encounter: Payer: Self-pay | Admitting: Ophthalmology

## 2020-09-09 DIAGNOSIS — Z91013 Allergy to seafood: Secondary | ICD-10-CM | POA: Insufficient documentation

## 2020-09-09 DIAGNOSIS — H2511 Age-related nuclear cataract, right eye: Secondary | ICD-10-CM | POA: Diagnosis not present

## 2020-09-09 DIAGNOSIS — Z7982 Long term (current) use of aspirin: Secondary | ICD-10-CM | POA: Insufficient documentation

## 2020-09-09 DIAGNOSIS — Z880 Allergy status to penicillin: Secondary | ICD-10-CM | POA: Insufficient documentation

## 2020-09-09 DIAGNOSIS — Z87891 Personal history of nicotine dependence: Secondary | ICD-10-CM | POA: Insufficient documentation

## 2020-09-09 HISTORY — PX: CATARACT EXTRACTION W/PHACO: SHX586

## 2020-09-09 SURGERY — PHACOEMULSIFICATION, CATARACT, WITH IOL INSERTION
Anesthesia: Monitor Anesthesia Care | Site: Eye | Laterality: Right

## 2020-09-09 MED ORDER — CYCLOPENTOLATE HCL 2 % OP SOLN
1.0000 [drp] | OPHTHALMIC | Status: AC | PRN
  Administered 2020-09-09 (×3): 1 [drp] via OPHTHALMIC

## 2020-09-09 MED ORDER — SIGHTPATH DOSE#1 BSS IO SOLN
INTRAOCULAR | Status: DC | PRN
  Administered 2020-09-09: 15 mL

## 2020-09-09 MED ORDER — ACETAMINOPHEN 10 MG/ML IV SOLN: 1000.0000 mg | Freq: Once | INTRAVENOUS | Status: DC | PRN

## 2020-09-09 MED ORDER — LIDOCAINE HCL (PF) 2 % IJ SOLN
INTRAOCULAR | Status: DC | PRN
  Administered 2020-09-09: 1 mL via INTRAOCULAR

## 2020-09-09 MED ORDER — SIGHTPATH DOSE#1 SODIUM HYALURONATE 10 MG/ML IO SOLUTION
PREFILLED_SYRINGE | INTRAOCULAR | Status: DC | PRN
  Administered 2020-09-09: 0.55 mL via INTRAOCULAR

## 2020-09-09 MED ORDER — FENTANYL CITRATE (PF) 100 MCG/2ML IJ SOLN
INTRAMUSCULAR | Status: DC | PRN
  Administered 2020-09-09: 100 ug via INTRAVENOUS

## 2020-09-09 MED ORDER — MIDAZOLAM HCL 2 MG/2ML IJ SOLN
INTRAMUSCULAR | Status: DC | PRN
  Administered 2020-09-09: 2 mg via INTRAVENOUS

## 2020-09-09 MED ORDER — PHENYLEPHRINE HCL 10 % OP SOLN
1.0000 [drp] | OPHTHALMIC | Status: AC
  Administered 2020-09-09 (×3): 1 [drp] via OPHTHALMIC

## 2020-09-09 MED ORDER — LACTATED RINGERS IV SOLN: INTRAVENOUS | Status: DC

## 2020-09-09 MED ORDER — SIGHTPATH DOSE#1 BSS IO SOLN
INTRAOCULAR | Status: DC | PRN
  Administered 2020-09-09: 56 mL via OPHTHALMIC

## 2020-09-09 MED ORDER — ONDANSETRON HCL 4 MG/2ML IJ SOLN: 4.0000 mg | Freq: Once | INTRAMUSCULAR | Status: DC | PRN

## 2020-09-09 MED ORDER — SIGHTPATH DOSE#1 SODIUM HYALURONATE 23 MG/ML IO SOLUTION
PREFILLED_SYRINGE | INTRAOCULAR | Status: DC | PRN
  Administered 2020-09-09: .6 mL via INTRAOCULAR

## 2020-09-09 MED ORDER — TETRACAINE HCL 0.5 % OP SOLN
1.0000 [drp] | OPHTHALMIC | Status: DC | PRN
  Administered 2020-09-09 (×3): 1 [drp] via OPHTHALMIC

## 2020-09-09 SURGICAL SUPPLY — 14 items
CANNULA ANT/CHMB 27GA (MISCELLANEOUS) ×4 IMPLANT
DISSECTOR HYDRO NUCLEUS 50X22 (MISCELLANEOUS) ×2 IMPLANT
GLOVE SURG GAMMEX PI TX LF 7.5 (GLOVE) ×2 IMPLANT
GLOVE SURG SYN 8.5  E (GLOVE) ×1
GLOVE SURG SYN 8.5 E (GLOVE) ×1 IMPLANT
GOWN STRL REUS W/ TWL LRG LVL3 (GOWN DISPOSABLE) ×2 IMPLANT
GOWN STRL REUS W/TWL LRG LVL3 (GOWN DISPOSABLE) ×4
LENS IOL TECNIS EYHANCE 19.0 (Intraocular Lens) ×2 IMPLANT
MARKER SKIN DUAL TIP RULER LAB (MISCELLANEOUS) ×2 IMPLANT
PACK EYE AFTER SURG (MISCELLANEOUS) ×2 IMPLANT
SYR 3ML LL SCALE MARK (SYRINGE) ×2 IMPLANT
SYR TB 1ML LUER SLIP (SYRINGE) ×2 IMPLANT
WATER STERILE IRR 250ML POUR (IV SOLUTION) ×2 IMPLANT
WIPE NON LINTING 3.25X3.25 (MISCELLANEOUS) ×2 IMPLANT

## 2020-09-09 NOTE — H&P (Signed)
[facility]   Primary Care Physician:  Dion Body, MD Ophthalmologist: Dr. Benay Pillow  Pre-Procedure History & Physical: HPI:  Wanda Abbott is a 70 y.o. female here for cataract surgery.   Past Medical History:  Diagnosis Date   Diverticulosis    History of colonic polyps    Hypercholesterolemia    Hypertension     Past Surgical History:  Procedure Laterality Date   CATARACT EXTRACTION W/PHACO Left 08/27/2020   Procedure: CATARACT EXTRACTION PHACO AND INTRAOCULAR LENS PLACEMENT (IOC) LEFT 3.91 00:25.6;  Surgeon: Eulogio Bear, MD;  Location: [place];  Service: Ophthalmology;  Laterality: Left;   COLONOSCOPY  10/14/2012   COLONOSCOPY WITH PROPOFOL N/A 02/15/2018   Procedure: COLONOSCOPY WITH PROPOFOL;  Surgeon: Lollie Sails, MD;  Location: [facility] END[facility]COPY;  Service: Endoscopy;  Laterality: N/A;   TUBAL LIGATION      Prior to Admission medications   Medication Sig Start Date End Date Taking? Authorizing Provider  aspirin EC 81 MG tablet Take 81 mg by mouth daily.   Yes [provider]  calcium carbonate (OSCAL) 1500 (600 Ca) MG TABS tablet Take 1,500 mg by mouth 2 (two) times daily with a meal.   Yes [provider]  fluticasone (FLONASE) 50 MCG/ACT nasal spray Place 2 sprays into both nostrils daily.   Yes [provider]  hydrochlorothiazide (HYDRODIURIL) 25 MG tablet Take 25 mg by mouth daily.   Yes [provider]  losartan (COZAAR) 100 MG tablet Take 100 mg by mouth daily.   Yes [provider]  lovastatin (MEVACOR) 20 MG tablet Take 20 mg by mouth at bedtime.   Yes [provider]  Multiple Vitamin (MULTIVITAMIN) tablet Take 1 tablet by mouth daily.   Yes [provider]    Allergies as of 07/31/2020 - Review Complete 02/15/2018  Allergen Reaction Noted   Penicillins Anaphylaxis 12/25/2016   Shellfish allergy Other (See Comments) 02/14/2018    Family History  Problem  Relation Age of Onset   Breast cancer Sister 28    Social History   Socioeconomic History   Marital status: Widowed    Spouse name: Not on file   Number of children: Not on file   Years of education: Not on file   Highest education level: Not on file  Occupational History   Not on file  Tobacco Use   Smoking status: Former    Packs/day: 1.00    Years: 24.00    Pack years: 24.00    Types: Cigarettes    Quit date: 03/01/1986    Years since quitting: 34.5   Smokeless tobacco: Current    Types: Chew  Vaping Use   Vaping Use: Never used  Substance and Sexual Activity   Alcohol use: Not Currently   Drug use: Never   Sexual activity: Not on file  Other Topics Concern   Not on file  Social History Narrative   Not on file   Social Determinants of Health   Financial Resource Strain: Not on file  Food Insecurity: Not on file  Transportation Needs: Not on file  Physical Activity: Not on file  Stress: Not on file  Social Connections: Not on file  Intimate Partner Violence: Not on file    Review of Systems: See HPI, otherwise negative ROS  Physical Exam: BP 117/75   Pulse (!) 57   Temp (!) 97.2 F (36.2 C) (Temporal)   Resp 18   Ht '5\' 2"'$  (1.575 m)  Wt 83.5 kg   SpO2 100%   BMI 33.65 kg/m  General:   Alert, cooperative in NAD Head:  Normocephalic and atraumatic. Respiratory:  Normal work of breathing. Cardiovascular:  RRR  Impression/Plan: Wanda Abbott is here for cataract surgery.  Risks, benefits, limitations, and alternatives regarding cataract surgery have been reviewed with the patient.  Questions have been answered.  All parties agreeable.   Benay Pillow, MD  09/09/2020, 11:39 AM

## 2020-09-09 NOTE — Anesthesia Postprocedure Evaluation (Signed)
Anesthesia Post Note  Patient: Wanda Abbott  Procedure(s) Performed: CATARACT EXTRACTION PHACO AND INTRAOCULAR LENS PLACEMENT (IOC) RIGHT 3.69 00:30.2 (Right: Eye)     Patient location during evaluation: PACU Anesthesia Type: MAC Level of consciousness: awake and alert Pain management: pain level controlled Vital Signs Assessment: post-procedure vital signs reviewed and stable Respiratory status: spontaneous breathing, nonlabored ventilation, respiratory function stable and patient connected to nasal cannula oxygen Cardiovascular status: stable and blood pressure returned to baseline Postop Assessment: no apparent nausea or vomiting Anesthetic complications: no   No notable events documented.  Wanda Abbott A  Wanda Abbott

## 2020-09-09 NOTE — Anesthesia Preprocedure Evaluation (Signed)
Anesthesia Evaluation  Patient identified by MRN, date of birth, ID band Patient awake    Reviewed: Allergy & Precautions, NPO status , Patient's Chart, lab work & pertinent test results, reviewed documented beta blocker date and time   History of Anesthesia Complications Negative for: history of anesthetic complications  Airway Mallampati: II  TM Distance: >3 FB Neck ROM: full    Dental no notable dental hx.    Pulmonary neg pulmonary ROS, former smoker,    Pulmonary exam normal breath sounds clear to auscultation       Cardiovascular Exercise Tolerance: Good hypertension, (-) angina(-) DOE Normal cardiovascular exam+ dysrhythmias (PACs)  Rhythm:Regular Rate:Normal     Neuro/Psych negative neurological ROS  negative psych ROS   GI/Hepatic negative GI ROS, Neg liver ROS, neg GERD  ,  Endo/Other  Morbid obesity (bmi 34)  Renal/GU CRFRenal disease (ckd3)  negative genitourinary   Musculoskeletal   Abdominal   Peds  Hematology negative hematology ROS (+)   Anesthesia Other Findings pcp: Dion Body, MD at 04/30/2020 ;  cards: Isaias Cowman, MD at 01/23/2020 ;  Reproductive/Obstetrics                             Anesthesia Physical  Anesthesia Plan  ASA: 2  Anesthesia Plan: MAC   Post-op Pain Management:    Induction: Intravenous  PONV Risk Score and Plan: 2 and TIVA, Midazolam and Treatment may vary due to age or medical condition  Airway Management Planned: Nasal Cannula  Additional Equipment:   Intra-op Plan:   Post-operative Plan:   Informed Consent: I have reviewed the patients History and Physical, chart, labs and discussed the procedure including the risks, benefits and alternatives for the proposed anesthesia with the patient or authorized representative who has indicated his/her understanding and acceptance.       Plan Discussed with:  CRNA  Anesthesia Plan Comments:         Anesthesia Quick Evaluation

## 2020-09-09 NOTE — Op Note (Signed)
OPERATIVE NOTE  BAYE SCHNOOR [id number] 09/09/2020   PREOPERATIVE DIAGNOSIS:  Nuclear sclerotic cataract right eye.  H25.11   POSTOPERATIVE DIAGNOSIS:    Nuclear sclerotic cataract right eye.     PROCEDURE:  Phacoemusification with posterior chamber intraocular lens placement of the right eye   LENS:   Implant Name Type Inv. Item Serial No. Manufacturer Lot No. LRB No. Used Action  LENS IOL TECNIS EYHANCE 19.0 - [id number] Intraocular Lens LENS IOL TECNIS EYHANCE 19.0 [id number] JOHNSON   Right 1 Implanted       Procedure(s): CATARACT EXTRACTION PHACO AND INTRAOCULAR LENS PLACEMENT (IOC) RIGHT 3.69 00:30.2 (Right)  DIB00 +19.0   ULTRASOUND TIME: 0 minutes 30 seconds.  CDE 3.69   SURGEON:  Benay Pillow, MD, MPH  ANESTHESIOLOGIST: Anesthesiologist: Heniser, Fredric Dine, MD CRNA: Silvana Newness, CRNA   ANESTHESIA:  Topical with tetracaine drops augmented with 1% preservative-free intracameral lidocaine.  ESTIMATED BLOOD LOSS: less than 1 mL.   COMPLICATIONS:  None.   DESCRIPTION OF PROCEDURE:  The patient was identified in the holding room and transported to the operating room and placed in the supine position under the operating microscope.  The right eye was identified as the operative eye and it was prepped and draped in the usual sterile ophthalmic fashion.   A 1.0 millimeter clear-corneal paracentesis was made at the 10:30 position. 0.5 ml of preservative-free 1% lidocaine with epinephrine was injected into the anterior chamber.  The anterior chamber was filled with Healon 5 viscoelastic.  A 2.4 millimeter keratome was used to make a near-clear corneal incision at the 8:00 position.  A curvilinear capsulorrhexis was made with a cystotome and capsulorrhexis forceps.  Balanced salt solution was used to hydrodissect and hydrodelineate the nucleus.   Phacoemulsification was then used in stop and chop fashion to remove the lens nucleus and epinucleus.  The remaining cortex was then  removed using the irrigation and aspiration handpiece. Healon was then placed into the capsular bag to distend it for lens placement.  A lens was then injected into the capsular bag.  The remaining viscoelastic was aspirated.   Wounds were hydrated with balanced salt solution.  The anterior chamber was inflated to a physiologic pressure with balanced salt solution.   Intracameral vigamox 0.1 mL undiluted was injected into the eye and a drop placed onto the ocular surface.  No wound leaks were noted.  The patient was taken to the recovery room in stable condition without complications of anesthesia or surgery  Benay Pillow 09/09/2020, 12:06 PM

## 2020-09-09 NOTE — Transfer of Care (Signed)
Immediate Anesthesia Transfer of Care Note  Patient: Wanda Abbott  Procedure(s) Performed: CATARACT EXTRACTION PHACO AND INTRAOCULAR LENS PLACEMENT (IOC) RIGHT 3.69 00:30.2 (Right: Eye)  Patient Location: PACU  Anesthesia Type: MAC  Level of Consciousness: awake, alert  and patient cooperative  Airway and Oxygen Therapy: Patient Spontanous Breathing and Patient connected to supplemental oxygen  Post-op Assessment: Post-op Vital signs reviewed, Patient's Cardiovascular Status Stable, Respiratory Function Stable, Patent Airway and No signs of Nausea or vomiting  Post-op Vital Signs: Reviewed and stable  Complications: No notable events documented.

## 2020-09-10 ENCOUNTER — Encounter: Payer: Self-pay | Admitting: Ophthalmology

## 2020-11-05 ENCOUNTER — Other Ambulatory Visit: Payer: Self-pay | Admitting: Family Medicine

## 2020-11-05 DIAGNOSIS — Z1231 Encounter for screening mammogram for malignant neoplasm of breast: Secondary | ICD-10-CM

## 2020-11-06 ENCOUNTER — Other Ambulatory Visit: Payer: Self-pay

## 2020-11-06 ENCOUNTER — Ambulatory Visit
Admission: RE | Admit: 2020-11-06 | Discharge: 2020-11-06 | Disposition: A | Discharge status: Home / Self Care | Payer: Medicare Other | Source: Ambulatory Visit | Attending: Family Medicine | Admitting: Family Medicine

## 2020-11-06 DIAGNOSIS — Z1231 Encounter for screening mammogram for malignant neoplasm of breast: Secondary | ICD-10-CM | POA: Insufficient documentation

## 2021-09-25 ENCOUNTER — Other Ambulatory Visit: Payer: Self-pay | Admitting: Family Medicine

## 2021-09-25 DIAGNOSIS — Z1231 Encounter for screening mammogram for malignant neoplasm of breast: Secondary | ICD-10-CM

## 2021-10-07 ENCOUNTER — Encounter: Payer: Self-pay | Admitting: Internal Medicine

## 2021-10-08 ENCOUNTER — Ambulatory Visit: Payer: Medicare Other | Admitting: Anesthesiology

## 2021-10-08 ENCOUNTER — Ambulatory Visit
Admission: RE | Admit: 2021-10-08 | Discharge: 2021-10-08 | Disposition: A | Discharge status: Home / Self Care | Payer: Medicare Other | Attending: Internal Medicine | Admitting: Internal Medicine

## 2021-10-08 ENCOUNTER — Encounter
Admission: RE | Disposition: A | Discharge status: Home / Self Care | Payer: Self-pay | Source: Home / Self Care | Attending: Internal Medicine

## 2021-10-08 ENCOUNTER — Encounter: Payer: Self-pay | Admitting: Internal Medicine

## 2021-10-08 DIAGNOSIS — N183 Chronic kidney disease, stage 3 unspecified: Secondary | ICD-10-CM | POA: Diagnosis not present

## 2021-10-08 DIAGNOSIS — I129 Hypertensive chronic kidney disease with stage 1 through stage 4 chronic kidney disease, or unspecified chronic kidney disease: Secondary | ICD-10-CM | POA: Insufficient documentation

## 2021-10-08 DIAGNOSIS — Z09 Encounter for follow-up examination after completed treatment for conditions other than malignant neoplasm: Secondary | ICD-10-CM | POA: Diagnosis not present

## 2021-10-08 DIAGNOSIS — E7800 Pure hypercholesterolemia, unspecified: Secondary | ICD-10-CM | POA: Insufficient documentation

## 2021-10-08 DIAGNOSIS — Z86010 Personal history of colonic polyps: Secondary | ICD-10-CM | POA: Diagnosis not present

## 2021-10-08 DIAGNOSIS — K5730 Diverticulosis of large intestine without perforation or abscess without bleeding: Secondary | ICD-10-CM | POA: Insufficient documentation

## 2021-10-08 HISTORY — PX: COLONOSCOPY: SHX5424

## 2021-10-08 SURGERY — COLONOSCOPY
Anesthesia: Monitor Anesthesia Care

## 2021-10-08 MED ORDER — SODIUM CHLORIDE 0.9 % IV SOLN: INTRAVENOUS | Status: DC

## 2021-10-08 MED ORDER — PROPOFOL 500 MG/50ML IV EMUL
INTRAVENOUS | Status: DC | PRN
  Administered 2021-10-08: 200 ug/kg/min via INTRAVENOUS

## 2021-10-08 MED ORDER — PROPOFOL 10 MG/ML IV BOLUS
INTRAVENOUS | Status: DC | PRN
  Administered 2021-10-08: 60 mg via INTRAVENOUS

## 2021-10-08 NOTE — Interval H&P Note (Signed)
History and Physical Interval Note:  10/08/2021 1:44 PM  Wanda Abbott  has presented today for surgery, with the diagnosis of Hx of adenomatous colonic polyps (Z86.010).  The various methods of treatment have been discussed with the patient and family. After consideration of risks, benefits and other options for treatment, the patient has consented to  Procedure(s): COLONOSCOPY (N/A) as a surgical intervention.  The patient's history has been reviewed, patient examined, no change in status, stable for surgery.  I have reviewed the patient's chart and labs.  Questions were answered to the patient's satisfaction.     [place], [place]

## 2021-10-08 NOTE — H&P (Signed)
Outpatient short stay form Pre-procedure 10/08/2021 1:43 PM Wanda Abbott Wanda Abbott, M.D.  Primary Physician: Dion Body, M.D.  Reason for visit:  Pesonal history of adenomatous colon polyps  History of present illness:  Last colonoscopy: 02/15/2018 colonoscopy for personal history of colon polyps impression: Diverticulosis, one 24 mm TA polyp proximal transverse colon. One 9 mm TA polyp proximal ascending colon. A post polypectomy scar was found at the tattoo site. No evidence of residual polyp tissue. Repeat colonoscopy 3 years.  HISTORY OF PRESENT ILLNESS   Wanda Abbott is a 71 year old female with history of chronic kidney disease stage III, hypertension, diverticulosis, hypercholesterolemia, adenomatous colon polyps. She returns for 3-year follow-up colonoscopy. She denies any upper or lower GI complaintsNo weight loss, anorexia, abdominal pain, change in bowel habits, or hematochezia. No chest pain, dysphagia, reflux, regurgitation, epigastric pain, nausea/vomiting, or melena.     Current Facility-Administered Medications:    0.9 %  sodium chloride infusion, , Intravenous, Continuous, Wylie, Benay Pike, MD, Last Rate: 20 mL/hr at 10/08/21 1307, New Bag at 10/08/21 1307  Medications Prior to Admission  Medication Sig Dispense Refill Last Dose   aspirin EC 81 MG tablet Take 81 mg by mouth daily.   10/07/2021   calcium carbonate (OSCAL) 1500 (600 Ca) MG TABS tablet Take 1,500 mg by mouth 2 (two) times daily with a meal.   10/07/2021   fluticasone (FLONASE) 50 MCG/ACT nasal spray Place 2 sprays into both nostrils daily.   10/07/2021   hydrochlorothiazide (HYDRODIURIL) 25 MG tablet Take 25 mg by mouth daily.   10/08/2021 at 0550   losartan (COZAAR) 100 MG tablet Take 100 mg by mouth daily.   10/08/2021 at 0550   lovastatin (MEVACOR) 20 MG tablet Take 20 mg by mouth at bedtime.   10/07/2021   Multiple Vitamin (MULTIVITAMIN) tablet Take 1 tablet by mouth daily.   Past Week     Allergies   Allergen Reactions   Penicillins Anaphylaxis   Shellfish Allergy Other (See Comments)     Past Medical History:  Diagnosis Date   Diverticulosis    History of colonic polyps    Hypercholesterolemia    Hypertension     Review of systems:  Otherwise negative.    Physical Exam  Gen: Alert, oriented. Appears stated age.  HEENT: [place]/AT. PERRLA. Lungs: CTA, no wheezes. CV: RR nl S1, S2. Abd: soft, benign, no masses. BS+ Ext: No edema. Pulses 2+    Planned procedures: Proceed with colonoscopy. The patient understands the nature of the planned procedure, indications, risks, alternatives and potential complications including but not limited to bleeding, infection, perforation, damage to internal organs and possible oversedation/side effects from anesthesia. The patient agrees and gives consent to proceed.  Please refer to procedure notes for findings, recommendations and patient disposition/instructions.     Wanda Abbott Wanda Abbott, M.D. Gastroenterology 10/08/2021  1:43 PM

## 2021-10-08 NOTE — Anesthesia Preprocedure Evaluation (Addendum)
Anesthesia Evaluation  Patient identified by MRN, date of birth, ID band Patient awake    Reviewed: Allergy & Precautions, NPO status , Patient's Chart, lab work & pertinent test results, reviewed documented beta blocker date and time   History of Anesthesia Complications Negative for: history of anesthetic complications  Airway Mallampati: II  TM Distance: >3 FB Neck ROM: full    Dental no notable dental hx.    Pulmonary neg pulmonary ROS, former smoker,    Pulmonary exam normal breath sounds clear to auscultation       Cardiovascular Exercise Tolerance: Good hypertension, (-) angina(-) DOE Normal cardiovascular exam+ dysrhythmias (PACs)  Rhythm:Regular Rate:Normal     Neuro/Psych negative neurological ROS  negative psych ROS   GI/Hepatic negative GI ROS, Neg liver ROS, neg GERD  ,  Endo/Other    Renal/GU CRFRenal disease (ckd3)  negative genitourinary   Musculoskeletal   Abdominal   Peds  Hematology negative hematology ROS (+)   Anesthesia Other Findings pcp: Dion Body, MD at 04/30/2020 ;  cards: Isaias Cowman, MD at 01/23/2020 ;  Reproductive/Obstetrics                             Anesthesia Physical  Anesthesia Plan  ASA: 2  Anesthesia Plan: MAC   Post-op Pain Management:    Induction: Intravenous  PONV Risk Score and Plan: 2 and TIVA and Treatment may vary due to age or medical condition  Airway Management Planned: Nasal Cannula  Additional Equipment:   Intra-op Plan:   Post-operative Plan:   Informed Consent: I have reviewed the patients History and Physical, chart, labs and discussed the procedure including the risks, benefits and alternatives for the proposed anesthesia with the patient or authorized representative who has indicated his/her understanding and acceptance.     Dental advisory given  Plan Discussed with: CRNA  Anesthesia Plan  Comments:        Anesthesia Quick Evaluation

## 2021-10-08 NOTE — Transfer of Care (Signed)
Immediate Anesthesia Transfer of Care Note  Patient: ABIGAYL HOR  Procedure(s) Performed: COLONOSCOPY  Patient Location: PACU  Anesthesia Type:General  Level of Consciousness: sedated  Airway & Oxygen Therapy: Patient Spontanous Breathing and Patient connected to nasal cannula oxygen  Post-op Assessment: Report given to RN and Post -op Vital signs reviewed and stable  Post vital signs: Reviewed and stable  Last Vitals:  Vitals Value Taken Time  BP 93/51 10/08/21 1414  Temp    Pulse 65 10/08/21 1415  Resp 13 10/08/21 1415  SpO2 100 % 10/08/21 1415  Vitals shown include unvalidated device data.  Last Pain:  Vitals:   10/08/21 1246  TempSrc: Temporal  PainSc: 0-No pain         Complications: No notable events documented.

## 2021-10-08 NOTE — Op Note (Signed)
[facility] Gastroenterology Patient Name: Wanda Abbott Procedure Date: 10/08/2021 1:44 PM MRN: [id number] Account #: [id number] Date of Birth: Dec 30, 1950 Admit Type: Outpatient Age: 71 Room: [facility] ENDO ROOM 2 Gender: Female Note Status: Finalized Instrument Name: Jasper Riling [id number] Procedure:             Colonoscopy Indications:           High risk colon cancer surveillance: Personal history                         of non-advanced adenoma Providers:             Benay Pike. Alice Reichert MD, MD Referring MD:          Dion Body (Referring MD) Medicines:             Propofol per Anesthesia Complications:         No immediate complications. Procedure:             Pre-Anesthesia Assessment:                        - The risks and benefits of the procedure and the                         sedation options and risks were discussed with the                         patient. All questions were answered and informed                         consent was obtained.                        - Patient identification and proposed procedure were                         verified prior to the procedure by the nurse. The                         procedure was verified in the procedure room.                        - ASA Grade Assessment: III - A patient with severe                         systemic disease.                        - After reviewing the risks and benefits, the patient                         was deemed in satisfactory condition to undergo the                         procedure.                        After obtaining informed consent, the colonoscope was                         passed under direct vision.  Throughout the procedure,                         the patient's blood pressure, pulse, and oxygen                         saturations were monitored continuously. The                         Colonoscope was introduced through the anus and                         advanced to  the the cecum, identified by appendiceal                         orifice and ileocecal valve. The colonoscopy was                         performed without difficulty. The patient tolerated                         the procedure well. The quality of the bowel                         preparation was good. Findings:      The perianal and digital rectal examinations were normal. Pertinent       negatives include normal sphincter tone and no palpable rectal lesions.      Many small and large-mouthed diverticula were found in the sigmoid colon.      A tattoo was seen in the transverse colon. The tattoo site appeared       normal.      No other significant abnormalities were identified in a careful       examination of the remainder of the colon. Impression:            - Diverticulosis in the sigmoid colon.                        - A tattoo was seen in the transverse colon. The                         tattoo site appeared normal.                        - No specimens collected. Recommendation:        - Patient has a contact number available for                         emergencies. The signs and symptoms of potential                         delayed complications were discussed with the patient.                         Return to normal activities tomorrow. Written                         discharge instructions were provided to the patient.                        -  Resume previous diet.                        - Continue present medications.                        - No repeat colonoscopy due to current age (66 years                         or older) and the absence of colonic polyps.                        - You do NOT require further colon cancer screening                         measures (Annual stool testing (i.e. hemoccult, FIT,                         cologuard), sigmoidoscopy, colonoscopy or CT                         colonography). You should share this recommendation                          with your Primary Care provider.                        - Return to GI office PRN.                        - The findings and recommendations were discussed with                         the patient. Procedure Code(s):     --- Professional ---                        G8676, Colorectal cancer screening; colonoscopy on                         individual at high risk Diagnosis Code(s):     --- Professional ---                        K57.30, Diverticulosis of large intestine without                         perforation or abscess without bleeding                        Z86.010, Personal history of colonic polyps CPT copyright 2019 [employer]. All rights reserved. The codes documented in this report are preliminary and upon coder review may  be revised to meet current compliance requirements. Efrain Sella MD, MD 10/08/2021 2:17:07 PM This report has been signed electronically. Number of Addenda: 0 Note Initiated On: 10/08/2021 1:44 PM Scope Withdrawal Time: 0 hours 6 minutes 52 seconds  Total Procedure Duration: 0 hours 10 minutes 3 seconds  Estimated Blood Loss:  Estimated blood loss: none.      [facility]

## 2021-10-08 NOTE — Anesthesia Procedure Notes (Signed)
Date/Time: 10/08/2021 2:02 PM  Performed by: Nelda Marseille, CRNAPre-anesthesia Checklist: Patient identified, Emergency Drugs available, Suction available, Patient being monitored and Timeout performed Oxygen Delivery Method: Nasal cannula

## 2021-10-09 ENCOUNTER — Encounter: Payer: Self-pay | Admitting: Internal Medicine

## 2021-10-09 NOTE — Anesthesia Postprocedure Evaluation (Signed)
Anesthesia Post Note  Patient: Wanda Abbott  Procedure(s) Performed: COLONOSCOPY  Patient location during evaluation: PACU Anesthesia Type: MAC Level of consciousness: awake and alert Pain management: pain level controlled Vital Signs Assessment: post-procedure vital signs reviewed and stable Respiratory status: spontaneous breathing, nonlabored ventilation and respiratory function stable Cardiovascular status: blood pressure returned to baseline and stable Postop Assessment: no apparent nausea or vomiting Anesthetic complications: no   No notable events documented.   Last Vitals:  Vitals:   10/08/21 1424 10/08/21 1434  BP: (!) 104/57 (!) 117/93  Pulse: 65 (!) 54  Resp: 14 17  Temp:    SpO2: 100% 99%    Last Pain:  Vitals:   10/08/21 1434  TempSrc:   PainSc: 0-No pain                 [place] Ouch

## 2021-11-10 ENCOUNTER — Ambulatory Visit
Admission: RE | Admit: 2021-11-10 | Discharge: 2021-11-10 | Disposition: A | Discharge status: Home / Self Care | Payer: Medicare Other | Source: Ambulatory Visit | Attending: Family Medicine | Admitting: Family Medicine

## 2021-11-10 DIAGNOSIS — Z1231 Encounter for screening mammogram for malignant neoplasm of breast: Secondary | ICD-10-CM | POA: Diagnosis not present

## 2022-05-05 IMAGING — MG MM DIGITAL SCREENING BILAT W/ TOMO AND CAD
8 series · 8 of 24 positions shown · non-contrast
Comparison: Previous exam(s).

CLINICAL DATA: Screening.

EXAM:
DIGITAL SCREENING BILATERAL MAMMOGRAM WITH TOMOSYNTHESIS AND CAD
TECHNIQUE: Bilateral screening digital craniocaudal and mediolateral oblique
mammograms were obtained. Bilateral screening digital breast
tomosynthesis was performed. The images were evaluated with
computer-aided detection.

[L CC synth-2D]
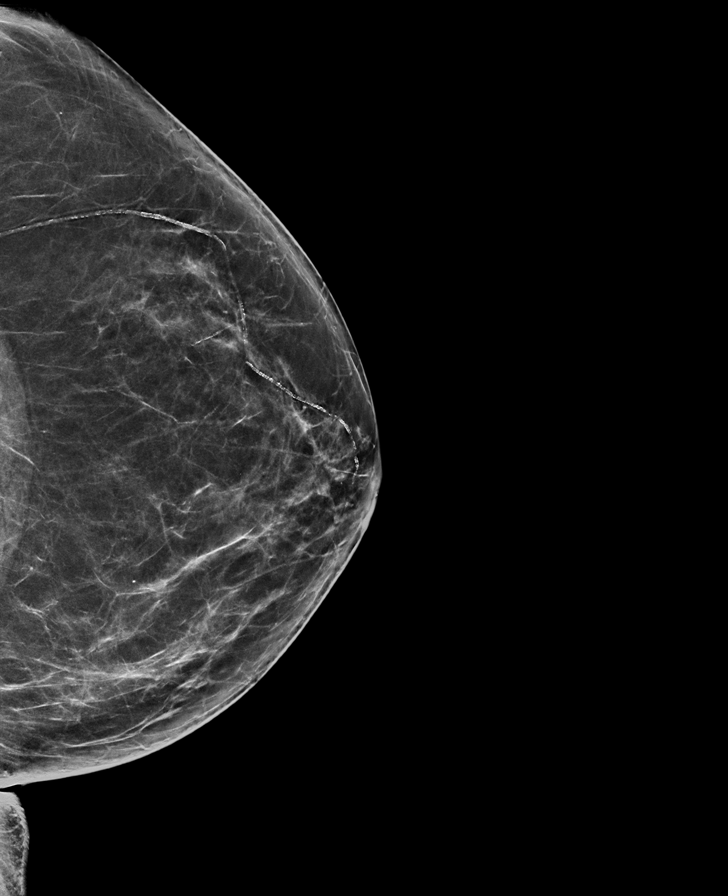

[L MLO synth-2D]
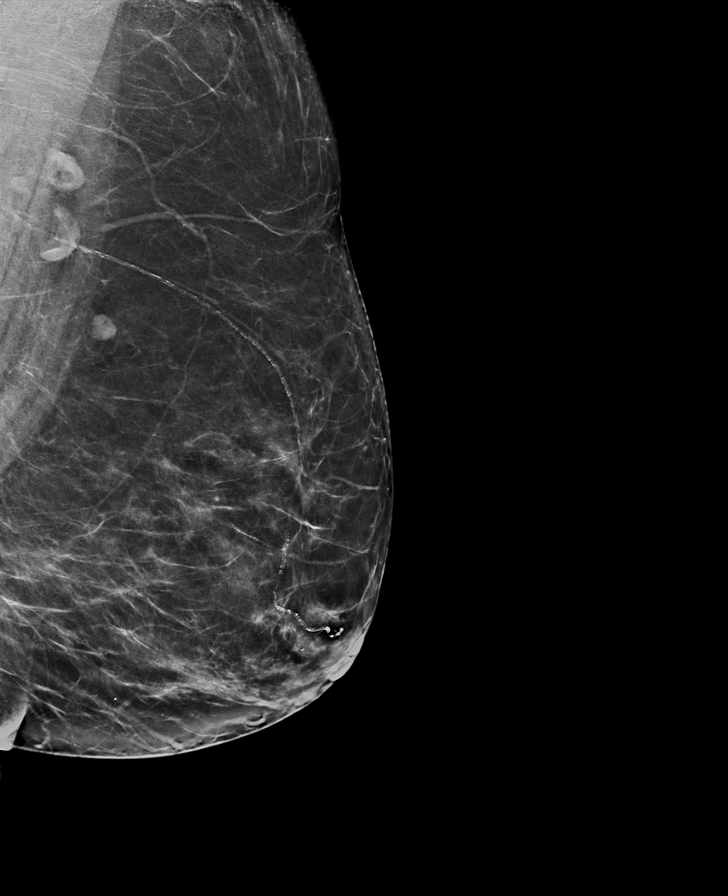

[R CC synth-2D]
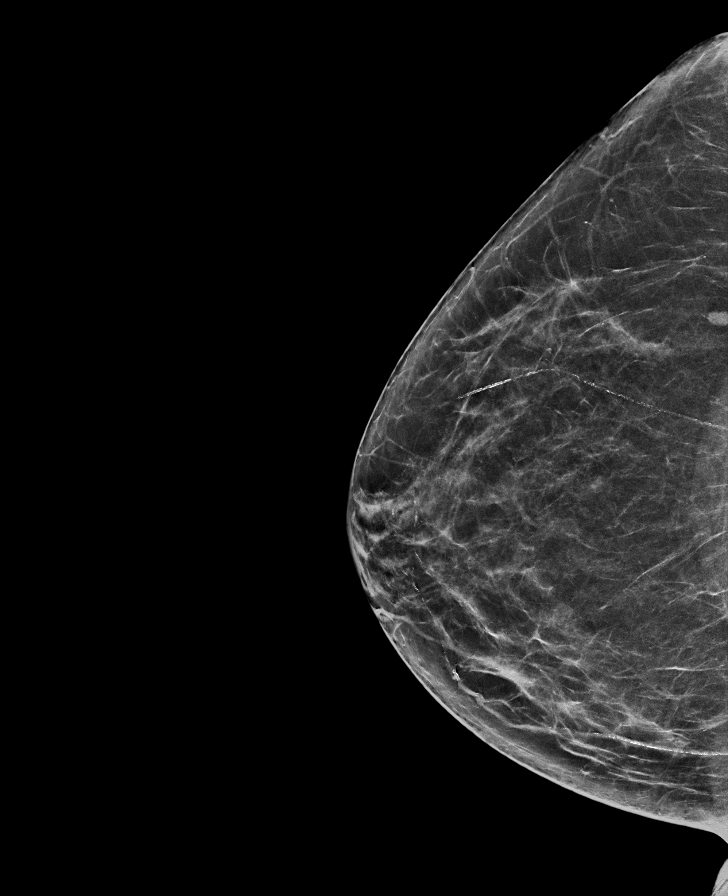

[R MLO synth-2D]
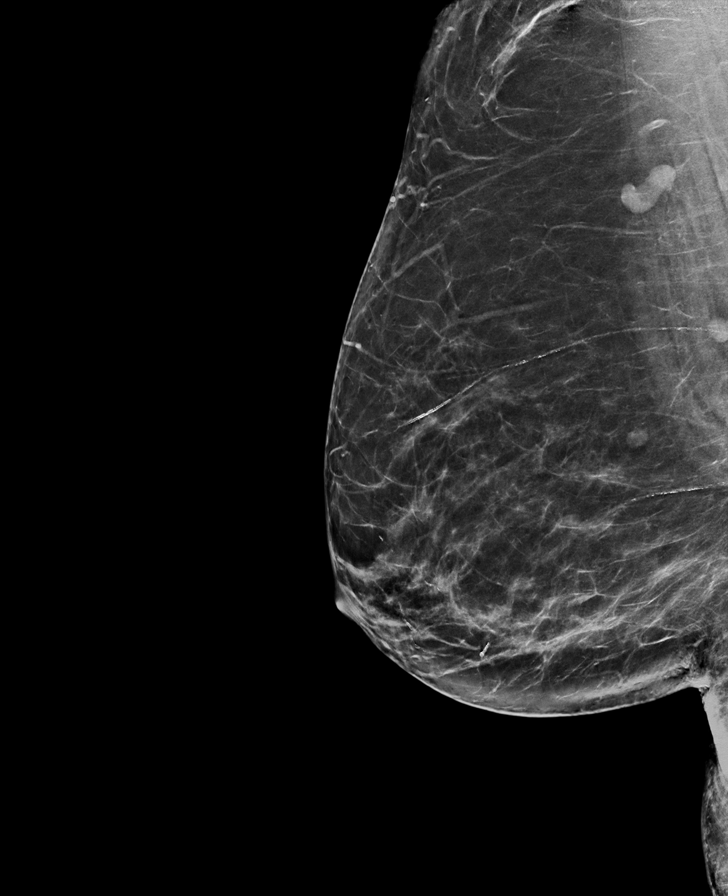

[L CC tomo · tomo slice 35/69.0]
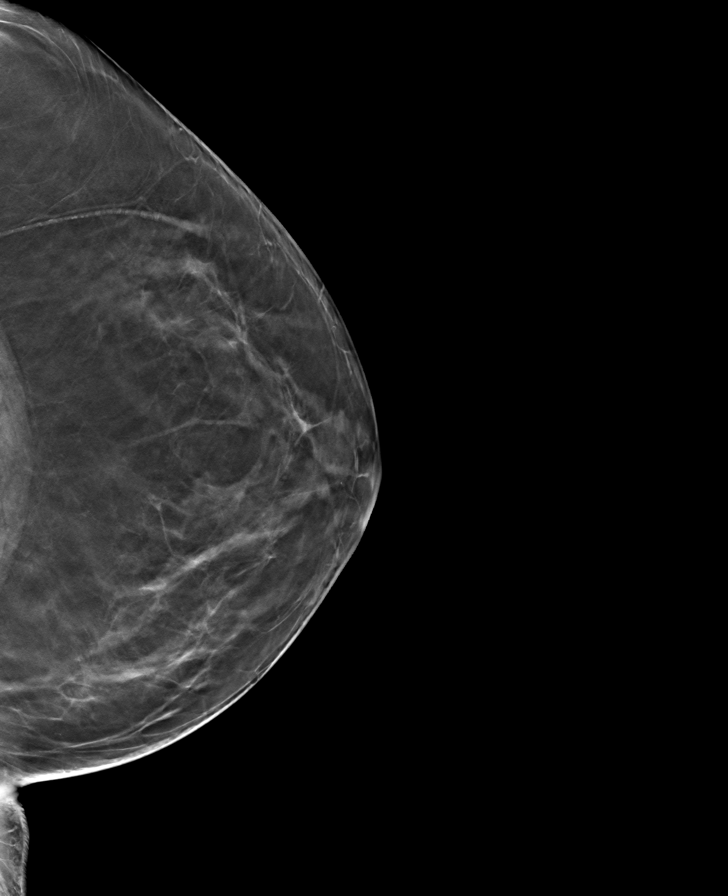

[R MLO tomo · tomo slice 39/76.0]
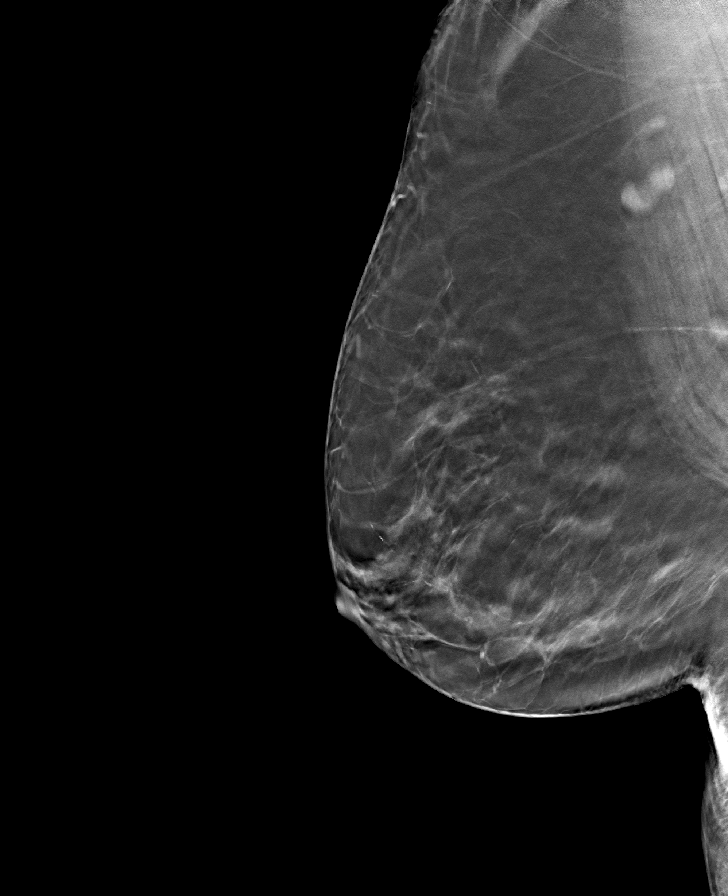

[R CC tomo · tomo slice 33/66.0]
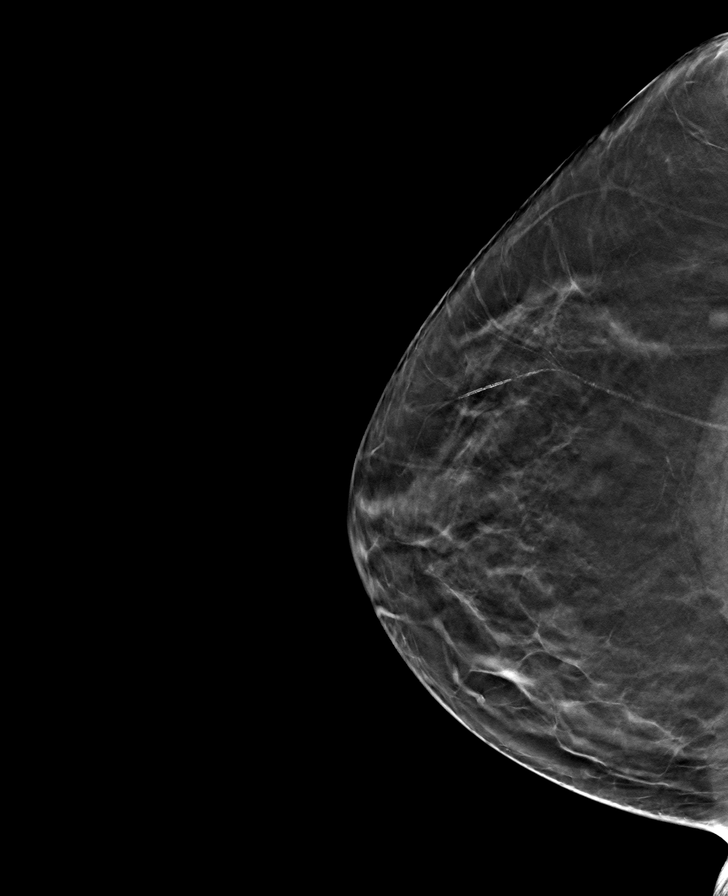

[L MLO tomo · tomo slice 39/77.0]
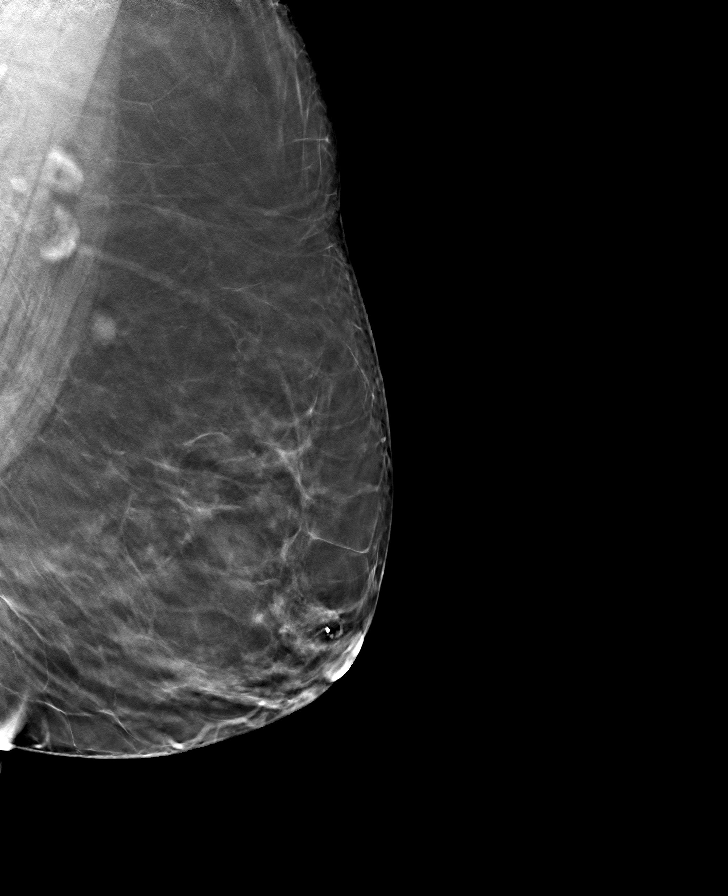

[8 of 24 positions shown; findings below may reference images not displayed]

ACR Breast Density Category b: There are scattered areas of
fibroglandular density.
FINDINGS: There are no findings suspicious for malignancy.
IMPRESSION: No mammographic evidence of malignancy. A result letter of this
screening mammogram will be mailed directly to the patient.

RECOMMENDATION:
Screening mammogram in one year. (Code:51-O-LD2)

BI-RADS CATEGORY  1: Negative.

## 2022-10-05 ENCOUNTER — Other Ambulatory Visit: Payer: Self-pay | Admitting: Family Medicine

## 2022-10-05 DIAGNOSIS — Z1231 Encounter for screening mammogram for malignant neoplasm of breast: Secondary | ICD-10-CM

## 2022-11-12 ENCOUNTER — Ambulatory Visit
Admission: RE | Admit: 2022-11-12 | Discharge: 2022-11-12 | Disposition: A | Discharge status: Home / Self Care | Payer: Medicare Other | Source: Ambulatory Visit | Attending: Family Medicine | Admitting: Family Medicine

## 2022-11-12 DIAGNOSIS — Z1231 Encounter for screening mammogram for malignant neoplasm of breast: Secondary | ICD-10-CM | POA: Diagnosis present

## 2023-12-15 ENCOUNTER — Other Ambulatory Visit: Payer: Self-pay | Admitting: Family Medicine

## 2023-12-15 DIAGNOSIS — Z1231 Encounter for screening mammogram for malignant neoplasm of breast: Secondary | ICD-10-CM

## 2024-01-18 ENCOUNTER — Ambulatory Visit
Admission: RE | Admit: 2024-01-18 | Discharge: 2024-01-18 | Disposition: A | Discharge status: Home / Self Care | Source: Ambulatory Visit | Attending: Family Medicine | Admitting: Family Medicine

## 2024-01-18 DIAGNOSIS — Z1231 Encounter for screening mammogram for malignant neoplasm of breast: Secondary | ICD-10-CM | POA: Insufficient documentation
